# Patient Record
Sex: Female | Born: 1950 | Race: Black or African American | Hispanic: No | Marital: Married | Smoking: Never smoker
Health system: Southern US, Community
[De-identification: ages and names within clinical notes are randomized; demographics above are authoritative.]

## PROBLEM LIST (undated history)

## (undated) DIAGNOSIS — I10 Essential (primary) hypertension: Secondary | ICD-10-CM

## (undated) HISTORY — PX: NASAL SINUS SURGERY: SHX719

---

## 2011-03-23 ENCOUNTER — Emergency Department (HOSPITAL_COMMUNITY): Payer: No Typology Code available for payment source

## 2011-03-23 ENCOUNTER — Encounter (HOSPITAL_COMMUNITY): Payer: Self-pay | Admitting: Family Medicine

## 2011-03-23 ENCOUNTER — Emergency Department (HOSPITAL_COMMUNITY)
Admission: EM | Admit: 2011-03-23 | Discharge: 2011-03-23 | Disposition: A | Discharge status: Home / Self Care | Payer: No Typology Code available for payment source | Attending: Emergency Medicine | Admitting: Emergency Medicine

## 2011-03-23 DIAGNOSIS — S2232XA Fracture of one rib, left side, initial encounter for closed fracture: Secondary | ICD-10-CM

## 2011-03-23 DIAGNOSIS — Z79899 Other long term (current) drug therapy: Secondary | ICD-10-CM | POA: Insufficient documentation

## 2011-03-23 DIAGNOSIS — I10 Essential (primary) hypertension: Secondary | ICD-10-CM | POA: Insufficient documentation

## 2011-03-23 DIAGNOSIS — M25532 Pain in left wrist: Secondary | ICD-10-CM

## 2011-03-23 DIAGNOSIS — M25539 Pain in unspecified wrist: Secondary | ICD-10-CM | POA: Insufficient documentation

## 2011-03-23 DIAGNOSIS — R51 Headache: Secondary | ICD-10-CM | POA: Insufficient documentation

## 2011-03-23 DIAGNOSIS — S2239XA Fracture of one rib, unspecified side, initial encounter for closed fracture: Secondary | ICD-10-CM | POA: Insufficient documentation

## 2011-03-23 DIAGNOSIS — Y998 Other external cause status: Secondary | ICD-10-CM | POA: Insufficient documentation

## 2011-03-23 DIAGNOSIS — M549 Dorsalgia, unspecified: Secondary | ICD-10-CM | POA: Insufficient documentation

## 2011-03-23 DIAGNOSIS — Y92410 Unspecified street and highway as the place of occurrence of the external cause: Secondary | ICD-10-CM | POA: Insufficient documentation

## 2011-03-23 HISTORY — DX: Essential (primary) hypertension: I10

## 2011-03-23 LAB — BASIC METABOLIC PANEL
Calcium: 9.4 mg/dL (ref 8.4–10.5)
GFR calc Af Amer: >90 mL/min (ref >90)
GFR calc non Af Amer: >90 mL/min (ref >90)
Sodium: 138 mEq/L (ref 135–145)

## 2011-03-23 LAB — CBC
MCH: 27.1 pg (ref 26.0–34.0)
MCHC: 36.3 g/dL — ABNORMAL HIGH (ref 30.0–36.0)
Platelets: 280 10*3/uL (ref 150–400)
RBC: 4.62 MIL/uL (ref 3.87–5.11)

## 2011-03-23 MED ORDER — IOHEXOL 300 MG/ML  SOLN
100.0000 mL | Freq: Once | INTRAMUSCULAR | Status: AC | PRN
  Administered 2011-03-23: 100 mL via INTRAVENOUS

## 2011-03-23 MED ORDER — MORPHINE SULFATE 2 MG/ML IJ SOLN
INTRAMUSCULAR | Status: AC
  Filled 2011-03-23: qty 1

## 2011-03-23 MED ORDER — HYDROMORPHONE HCL PF 1 MG/ML IJ SOLN
0.5000 mg | Freq: Once | INTRAMUSCULAR | Status: AC
  Administered 2011-03-23: 0.5 mg via INTRAVENOUS
  Filled 2011-03-23 (×2): qty 1

## 2011-03-23 MED ORDER — MORPHINE SULFATE 4 MG/ML IJ SOLN
8.0000 mg | Freq: Once | INTRAMUSCULAR | Status: AC
  Administered 2011-03-23: 8 mg via INTRAVENOUS
  Filled 2011-03-23: qty 2

## 2011-03-23 MED ORDER — OXYCODONE-ACETAMINOPHEN 5-325 MG PO TABS: 1.0000 | ORAL_TABLET | ORAL | Status: AC | PRN

## 2011-03-23 MED ORDER — MORPHINE SULFATE 4 MG/ML IJ SOLN
INTRAMUSCULAR | Status: AC
  Filled 2011-03-23: qty 1

## 2011-03-23 MED ORDER — MORPHINE SULFATE 4 MG/ML IJ SOLN
8.0000 mg | Freq: Once | INTRAMUSCULAR | Status: AC
  Administered 2011-03-23: 8 mg via INTRAVENOUS
  Filled 2011-03-23: qty 1
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
## 2011-03-23 NOTE — ED Notes (Signed)
Pt return from. Son at bedside.
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
## 2011-03-23 NOTE — ED Notes (Signed)
Pt in radiology at this time. 
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
## 2011-03-23 NOTE — ED Notes (Signed)
Patient transported to X-ray 

## 2011-03-23 NOTE — ED Notes (Signed)
Family updated as to patient's status.

## 2013-09-14 ENCOUNTER — Emergency Department (HOSPITAL_COMMUNITY)
Admission: EM | Admit: 2013-09-14 | Discharge: 2013-09-14 | Disposition: A | Discharge status: Home / Self Care | Payer: BC Managed Care – PPO | Attending: Emergency Medicine | Admitting: Emergency Medicine

## 2013-09-14 ENCOUNTER — Encounter (HOSPITAL_COMMUNITY): Payer: Self-pay | Admitting: Emergency Medicine

## 2013-09-14 DIAGNOSIS — M545 Low back pain, unspecified: Secondary | ICD-10-CM | POA: Insufficient documentation

## 2013-09-14 DIAGNOSIS — Z79899 Other long term (current) drug therapy: Secondary | ICD-10-CM | POA: Insufficient documentation

## 2013-09-14 DIAGNOSIS — I10 Essential (primary) hypertension: Secondary | ICD-10-CM | POA: Insufficient documentation

## 2013-09-14 MED ORDER — DEXAMETHASONE SODIUM PHOSPHATE 4 MG/ML IJ SOLN
10.0000 mg | Freq: Once | INTRAMUSCULAR | Status: DC
  Filled 2013-09-14: qty 3

## 2013-09-14 MED ORDER — DEXAMETHASONE SODIUM PHOSPHATE 4 MG/ML IJ SOLN
10.0000 mg | Freq: Once | INTRAMUSCULAR | Status: AC
  Administered 2013-09-14: 10 mg via INTRAMUSCULAR

## 2013-09-14 MED ORDER — DEXAMETHASONE SODIUM PHOSPHATE 10 MG/ML IJ SOLN: 10.0000 mg | Freq: Once | INTRAMUSCULAR | Status: DC

## 2013-09-14 NOTE — Discharge Instructions (Signed)
Continue your medications given by your physician. Try heating pads. Add aleve or ibuprofen for additional symptom relief. Try stretches and exercises. Follow up with primary care doctor if not improving.    Back Pain, Adult Low back pain is very common. About 1 in 5 people have back pain.The cause of low back pain is rarely dangerous. The pain often gets better over time.About half of people with a sudden onset of back pain feel better in just 2 weeks. About 8 in 10 people feel better by 6 weeks.  CAUSES Some common causes of back pain include:  Strain of the muscles or ligaments supporting the spine.  Wear and tear (degeneration) of the spinal discs.  Arthritis.  Direct injury to the back. DIAGNOSIS Most of the time, the direct cause of low back pain is not known.However, back pain can be treated effectively even when the exact cause of the pain is unknown.Answering your caregiver's questions about your overall health and symptoms is one of the most accurate ways to make sure the cause of your pain is not dangerous. If your caregiver needs more information, he or she may order lab work or imaging tests (X-rays or MRIs).However, even if imaging tests show changes in your back, this usually does not require surgery. HOME CARE INSTRUCTIONS For many people, back pain returns.Since low back pain is rarely dangerous, it is often a condition that people can learn to [facility] Incmanageon their own.   Remain active. It is stressful on the back to sit or stand in one place. Do not sit, drive, or stand in one place for more than 30 minutes at a time. Take short walks on level surfaces as soon as pain allows.Try to increase the length of time you walk each day.  Do not stay in bed.Resting more than 1 or 2 days can delay your recovery.  Do not avoid exercise or work.Your body is made to move.It is not dangerous to be active, even though your back may hurt.Your back will likely heal faster if you return  to being active before your pain is gone.  Pay attention to your body when you bend and lift. Many people have less discomfortwhen lifting if they bend their knees, keep the load close to their bodies,and avoid twisting. Often, the most comfortable positions are those that put less stress on your recovering back.  Find a comfortable position to sleep. Use a firm mattress and lie on your side with your knees slightly bent. If you lie on your back, put a pillow under your knees.  Only take over-the-counter or prescription medicines as directed by your caregiver. Over-the-counter medicines to reduce pain and inflammation are often the most helpful.Your caregiver may prescribe muscle relaxant drugs.These medicines help dull your pain so you can more quickly return to your normal activities and healthy exercise.  Put ice on the injured area.  Put ice in a plastic bag.  Place a towel between your skin and the bag.  Leave the ice on for 15-20 minutes, 03-04 times a day for the first 2 to 3 days. After that, ice and heat may be alternated to reduce pain and spasms.  Ask your caregiver about trying back exercises and gentle massage. This may be of some benefit.  Avoid feeling anxious or stressed.Stress increases muscle tension and can worsen back pain.It is important to recognize when you are anxious or stressed and learn ways to manage it.Exercise is a great option. SEEK MEDICAL CARE IF:  You  have pain that is not relieved with rest or medicine.  You have pain that does not improve in 1 week.  You have new symptoms.  You are generally not feeling well. SEEK IMMEDIATE MEDICAL CARE IF:   You have pain that radiates from your back into your legs.  You develop new bowel or bladder control problems.  You have unusual weakness or numbness in your arms or legs.  You develop nausea or vomiting.  You develop abdominal pain.  You feel faint. Document Released: 02/11/2005 Document  Revised: 08/13/2011 Document Reviewed: 07/02/2010 [facility] Patient Information 2015 [place], [place]. This information is not intended to replace advice given to you by your health care provider. Make sure you discuss any questions you have with your health care provider.   Back Exercises Back exercises help treat and prevent back injuries. The goal of back exercises is to increase the strength of your abdominal and back muscles and the flexibility of your back. These exercises should be started when you no longer have back pain. Back exercises include:  Pelvic Tilt. Lie on your back with your knees bent. Tilt your pelvis until the lower part of your back is against the floor. Hold this position 5 to 10 sec and repeat 5 to 10 times.  Knee to Chest. Pull first 1 knee up against your chest and hold for 20 to 30 seconds, repeat this with the other knee, and then both knees. This may be done with the other leg straight or bent, whichever feels better.  Sit-Ups or Curl-Ups. Bend your knees 90 degrees. Start with tilting your pelvis, and do a partial, slow sit-up, lifting your trunk only 30 to 45 degrees off the floor. Take at least 2 to 3 seconds for each sit-up. Do not do sit-ups with your knees out straight. If partial sit-ups are difficult, simply do the above but with only tightening your abdominal muscles and holding it as directed.  Hip-Lift. Lie on your back with your knees flexed 90 degrees. Push down with your feet and shoulders as you raise your hips a couple inches off the floor; hold for 10 seconds, repeat 5 to 10 times.  Back arches. Lie on your stomach, propping yourself up on bent elbows. Slowly press on your hands, causing an arch in your low back. Repeat 3 to 5 times. Any initial stiffness and discomfort should lessen with repetition over time.  Shoulder-Lifts. Lie face down with arms beside your body. Keep hips and torso pressed to floor as you slowly lift your head and shoulders off the  floor. Do not overdo your exercises, especially in the beginning. Exercises may cause you some mild back discomfort which lasts for a few minutes; however, if the pain is more severe, or lasts for more than 15 minutes, do not continue exercises until you see your caregiver. Improvement with exercise therapy for back problems is slow.  See your caregivers for assistance with developing a proper back exercise program. Document Released: 03/21/2004 Document Revised: 05/06/2011 Document Reviewed: 12/13/2010 [facility] Patient Information 2015 [place], [place]. This information is not intended to replace advice given to you by your health care provider. Make sure you discuss any questions you have with your health care provider.

## 2013-09-14 NOTE — ED Provider Notes (Signed)
CSN: [id number]     Arrival date & time 09/14/13  1928 History  This chart was scribed for Jaynie Crumbleatyana Ainsley Sanguinetti, PA-C working with Richardean Canalavid H Yao, MD by Evon Slackerrance Branch, ED Scribe. This patient was seen in room TR07C/TR07C and the patient's care was started at 9:00 PM.     Chief Complaint  Patient presents with  . Back Pain   The history is provided by the patient. No language interpreter was used.   HPI Comments: Jacqueline Reed is a 63 y.o. female who presents to the Emergency Department complaining of lower back pain onset 3 days prior.  She states that there was no injury to her back. She states that the pain doesn't radiate. She states her pain worsens with twisting. She states she went to see her PCP who prescribed her Robaxin and Vicodin that she has been taking with no relief. She denies dysuria, fever, hematuria, or urinary frequency. No loss of bladder or bowel control. No abdominal pain  Past Medical History  Diagnosis Date  . Hypertension    Past Surgical History  Procedure Laterality Date  . Nasal sinus surgery     History reviewed. No pertinent family history. History  Substance Use Topics  . Smoking status: Never Smoker   . Smokeless tobacco: Not on file  . Alcohol Use: No   OB History   Grav Para Term Preterm Abortions TAB SAB Ect Mult Living                 Review of Systems  Constitutional: Negative for fever.  Genitourinary: Negative for dysuria, frequency and hematuria.  Musculoskeletal: Positive for back pain.    Allergies  Review of patient's allergies indicates no known allergies.  Home Medications   Prior to Admission medications   Medication Sig Start Date End Date Taking? Authorizing Provider  amLODipine-benazepril (LOTREL) 5-20 MG per capsule Take 1 capsule by mouth daily.    Historical Provider, MD  atorvastatin (LIPITOR) 20 MG tablet Take 20 mg by mouth daily.    Historical Provider, MD  Prenatal Vit-Fe Fumarate-FA (PRENATAL MULTIVITAMIN) TABS  Take 1 tablet by mouth daily.    Historical Provider, MD   Triage Vitals: BP 166/82  Pulse 83  Temp(Src) 97.9 F (36.6 C) (Oral)  Resp 16  Ht 5\' 10"  (1.778 m)  Wt 243 lb (110.224 kg)  BMI 34.87 kg/m2  SpO2 97%  Physical Exam  Nursing note and vitals reviewed. Constitutional: She is oriented to person, place, and time. She appears well-developed and well-nourished. No distress.  HENT:  Head: Normocephalic and atraumatic.  Eyes: Conjunctivae and EOM are normal.  Neck: Neck supple. No tracheal deviation present.  Cardiovascular: Normal rate.   Pulmonary/Chest: Effort normal. No respiratory distress.  Musculoskeletal: Normal range of motion.  Midline lumbar spine tenderness. No paravertebral tenderness bilaterally. No Pain with straight leg raise bilaterally.  Neurological: She is alert and oriented to person, place, and time.  5/5 and equal lower extremity strength. 2+ and equal patellar reflexes bilaterally. Pt able to dorsiflex bilateral toes and feet with good strength against resistance. Equal sensation bilaterally over thighs and lower legs.    Skin: Skin is warm and dry.  Psychiatric: She has a normal mood and affect. Her behavior is normal.    ED Course  Procedures (including critical care time) DIAGNOSTIC STUDIES: Oxygen Saturation is 97% on RA, normal by my interpretation.    COORDINATION OF CARE:    Labs Review Labs Reviewed - No data to  display  Imaging Review No results found.   EKG Interpretation None      MDM   Final diagnoses:  Midline low back pain without sciatica    Patient with lower back pain, with no radiation, no numbness, weakness, bowel or urine incontinence. Already has Norco and Flexeril at home for pain, she is here because she's concerned that her pain is not improving. Patient requesting a shot. We will do Decadron IM, and NSAIDs at home to her current medications. Follow with primary care Dr. No further merging imaging is  indicated  Filed Vitals:   09/14/13 1951  BP: 166/82  Pulse: 83  Temp: 97.9 F (36.6 C)  TempSrc: Oral  Resp: 16  Height: 5\' 10"  (1.778 m)  Weight: 243 lb (110.224 kg)  SpO2: 97%     I personally performed the services described in this documentation, which was scribed in my presence. The recorded information has been reviewed and is accurate.     Lottie Mussel, PA-C 09/15/13 0225

## 2013-09-14 NOTE — ED Notes (Signed)
Discharge instructions reviewed with pt. Pt verbalized understanding.   
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
## 2013-09-14 NOTE — ED Notes (Signed)
Pt reports pain to lower back that wraps around to the front. Pt reports going to pcp for pain, was given a muscle relaxant but pain has not gotten and better. Pt was taking aleve with no relief. 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 Reports pain 8-9/10.

## 2013-09-16 NOTE — ED Provider Notes (Signed)
Medical screening examination/treatment/procedure(s) were performed by non-physician practitioner and as supervising physician I was immediately available for consultation/collaboration.   EKG Interpretation None        Richardean Canalavid H Richardine Peppers, MD 09/16/13 1123

## 2016-10-17 ENCOUNTER — Encounter: Payer: Medicare Other | Admitting: Diagnostic Neuroimaging

## 2016-10-18 ENCOUNTER — Encounter: Payer: Self-pay | Admitting: Diagnostic Neuroimaging

## 2019-05-01 ENCOUNTER — Ambulatory Visit: Payer: Medicare PPO | Attending: Internal Medicine

## 2019-05-01 DIAGNOSIS — Z23 Encounter for immunization: Secondary | ICD-10-CM | POA: Insufficient documentation

## 2019-05-01 NOTE — Progress Notes (Signed)
   Covid-19 Vaccination Clinic  Name:  Jacqueline Reed    MRN: [id number] DOB: 1950-07-06  05/01/2019  [name] was observed post Covid-19 immunization for 15 minutes without incident. She was provided with Vaccine Information Sheet and instruction to access the V-Safe system.   [name] was instructed to call 911 with any severe reactions post vaccine: Marland Kitchen Difficulty breathing  . Swelling of face and throat  . A fast heartbeat  . A bad rash all over body  . Dizziness and weakness   Immunizations Administered    Name Date Dose VIS Date Route   Pfizer COVID-19 Vaccine 05/01/2019 10:10 AM 0.3 mL 02/05/2019 Intramuscular   Manufacturer: [employer], [employer]   Lot: [id number]   NDC: [address]-2

## 2019-06-01 ENCOUNTER — Ambulatory Visit: Payer: Medicare PPO | Attending: Internal Medicine

## 2019-06-01 DIAGNOSIS — Z23 Encounter for immunization: Secondary | ICD-10-CM

## 2019-06-01 NOTE — Progress Notes (Signed)
   Covid-19 Vaccination Clinic  Name:  Jacqueline Reed    MRN: [id number] DOB: 02-15-1951  06/01/2019  [name] was observed post Covid-19 immunization for 15 minutes without incident. She was provided with Vaccine Information Sheet and instruction to access the V-Safe system.   [name] was instructed to call 911 with any severe reactions post vaccine: Marland Kitchen Difficulty breathing  . Swelling of face and throat  . A fast heartbeat  . A bad rash all over body  . Dizziness and weakness   Immunizations Administered    Name Date Dose VIS Date Route   Pfizer COVID-19 Vaccine 06/01/2019  9:43 AM 0.3 mL 02/05/2019 Intramuscular   Manufacturer: [employer], [employer]   Lot: [id number]   NDC: [address]-9

## 2019-12-03 ENCOUNTER — Ambulatory Visit: Payer: Medicare PPO | Attending: Internal Medicine

## 2019-12-03 DIAGNOSIS — Z23 Encounter for immunization: Secondary | ICD-10-CM

## 2019-12-03 NOTE — Progress Notes (Signed)
   Covid-19 Vaccination Clinic  Name:  Jacqueline Reed    MRN: [id number] DOB: 1950/04/12  12/03/2019  [name] was observed post Covid-19 immunization for 15 minutes without incident. She was provided with Vaccine Information Sheet and instruction to access the V-Safe system.   [name] was instructed to call 911 with any severe reactions post vaccine: Marland Kitchen Difficulty breathing  . Swelling of face and throat  . A fast heartbeat  . A bad rash all over body  . Dizziness and weakness

## 2020-05-27 ENCOUNTER — Emergency Department
Admission: EM | Admit: 2020-05-27 | Discharge: 2020-05-27 | Disposition: A | Discharge status: Home / Self Care | Payer: Medicare PPO | Attending: Emergency Medicine | Admitting: Emergency Medicine

## 2020-05-27 ENCOUNTER — Encounter: Payer: Self-pay | Admitting: Intensive Care

## 2020-05-27 ENCOUNTER — Other Ambulatory Visit: Payer: Self-pay

## 2020-05-27 ENCOUNTER — Emergency Department: Payer: Medicare PPO

## 2020-05-27 DIAGNOSIS — S63501A Unspecified sprain of right wrist, initial encounter: Secondary | ICD-10-CM | POA: Diagnosis not present

## 2020-05-27 DIAGNOSIS — I10 Essential (primary) hypertension: Secondary | ICD-10-CM | POA: Diagnosis not present

## 2020-05-27 DIAGNOSIS — S199XXA Unspecified injury of neck, initial encounter: Secondary | ICD-10-CM | POA: Diagnosis present

## 2020-05-27 DIAGNOSIS — S161XXA Strain of muscle, fascia and tendon at neck level, initial encounter: Secondary | ICD-10-CM | POA: Insufficient documentation

## 2020-05-27 DIAGNOSIS — Z79899 Other long term (current) drug therapy: Secondary | ICD-10-CM | POA: Diagnosis not present

## 2020-05-27 DIAGNOSIS — J0100 Acute maxillary sinusitis, unspecified: Secondary | ICD-10-CM

## 2020-05-27 DIAGNOSIS — S39012A Strain of muscle, fascia and tendon of lower back, initial encounter: Secondary | ICD-10-CM | POA: Diagnosis not present

## 2020-05-27 DIAGNOSIS — S0990XA Unspecified injury of head, initial encounter: Secondary | ICD-10-CM | POA: Diagnosis not present

## 2020-05-27 DIAGNOSIS — Y92410 Unspecified street and highway as the place of occurrence of the external cause: Secondary | ICD-10-CM | POA: Insufficient documentation

## 2020-05-27 LAB — URINALYSIS, COMPLETE (UACMP) WITH MICROSCOPIC
Glucose, UA: NEGATIVE mg/dL
Hgb urine dipstick: NEGATIVE
Ketones, ur: 5 mg/dL — AB
Leukocytes,Ua: NEGATIVE
Nitrite: NEGATIVE
Protein, ur: 30 mg/dL — AB
Specific Gravity, Urine: 1.028 (ref 1.005–1.030)
pH: 5 (ref 5.0–8.0)

## 2020-05-27 MED ORDER — BACLOFEN 10 MG PO TABS: 10.0000 mg | ORAL_TABLET | Freq: Three times a day (TID) | ORAL | 0 refills | Status: AC

## 2020-05-27 MED ORDER — MELOXICAM 15 MG PO TABS: 15.0000 mg | ORAL_TABLET | Freq: Every day | ORAL | 2 refills | Status: AC

## 2020-05-27 MED ORDER — TRAMADOL HCL 50 MG PO TABS: 50.0000 mg | ORAL_TABLET | Freq: Four times a day (QID) | ORAL | 0 refills | Status: AC | PRN
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
## 2020-05-27 NOTE — Discharge Instructions (Addendum)
Follow-up with your regular doctor as needed Follow-up with [facility] for the problems with your sinuses noted on CT Follow-up with [facility] orthopedics if your neck and back pain or not improving in 5 to 7 days Take the meloxicam and baclofen as prescribed.  Tylenol as needed for pain.  Tramadol for pain not controlled by previous medications Return emergency department if you are worsening
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
## 2020-06-27 ENCOUNTER — Other Ambulatory Visit: Payer: Self-pay | Admitting: Unknown Physician Specialty

## 2020-06-27 DIAGNOSIS — J329 Chronic sinusitis, unspecified: Secondary | ICD-10-CM

## 2020-07-11 ENCOUNTER — Other Ambulatory Visit: Payer: Self-pay

## 2020-07-11 ENCOUNTER — Ambulatory Visit
Admission: RE | Admit: 2020-07-11 | Discharge: 2020-07-11 | Disposition: A | Discharge status: Home / Self Care | Payer: Medicare PPO | Source: Ambulatory Visit | Attending: Unknown Physician Specialty | Admitting: Unknown Physician Specialty

## 2020-07-11 DIAGNOSIS — J329 Chronic sinusitis, unspecified: Secondary | ICD-10-CM | POA: Diagnosis not present

## 2020-08-08 ENCOUNTER — Other Ambulatory Visit: Payer: Self-pay | Admitting: Unknown Physician Specialty

## 2020-08-08 DIAGNOSIS — J329 Chronic sinusitis, unspecified: Secondary | ICD-10-CM

## 2020-08-21 ENCOUNTER — Ambulatory Visit
Admission: RE | Admit: 2020-08-21 | Discharge: 2020-08-21 | Disposition: A | Discharge status: Home / Self Care | Payer: Medicare PPO | Source: Ambulatory Visit | Attending: Unknown Physician Specialty | Admitting: Unknown Physician Specialty

## 2020-08-21 ENCOUNTER — Other Ambulatory Visit: Payer: Self-pay

## 2020-08-21 DIAGNOSIS — J329 Chronic sinusitis, unspecified: Secondary | ICD-10-CM
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
## 2022-10-09 IMAGING — CT CT MAXILLOFACIAL W/O CM
3 of 5 series · 15 of 47 positions shown, 18 images · non-contrast
Comparison: 05/27/2020

CLINICAL DATA: Right sphenoid sinus disease on previous head CT,
preoperative evaluation

EXAM:
CT MAXILLOFACIAL WITHOUT CONTRAST
TECHNIQUE: Multidetector CT imaging of the maxillofacial structures was
performed. Multiplanar CT image reconstructions were also generated.

[Series 6: sinus 2.00 cor · coronal · 0.33mm/px · 3 of 68 slices shown]
[im 23/68  bone]
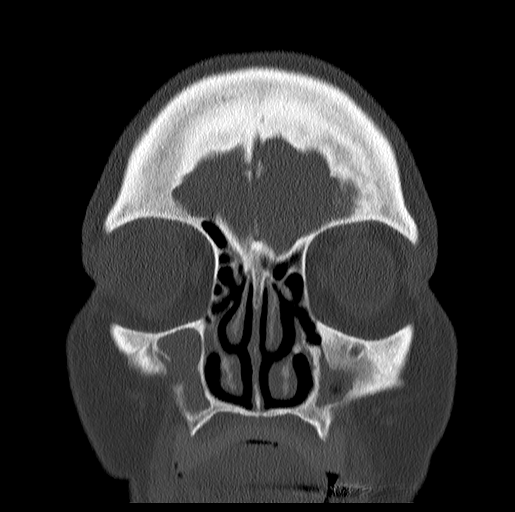
[im 30/68  bone]
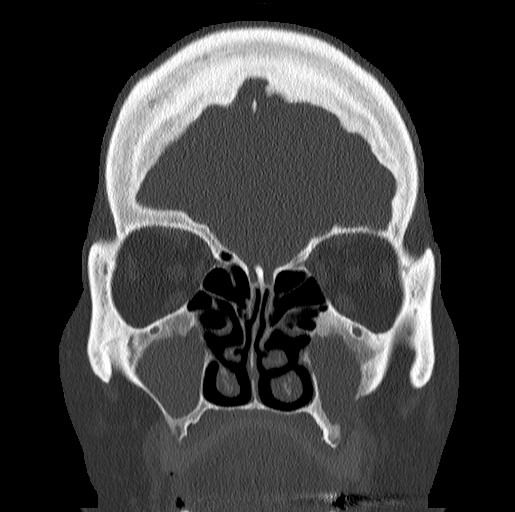
[im 38/68  bone]
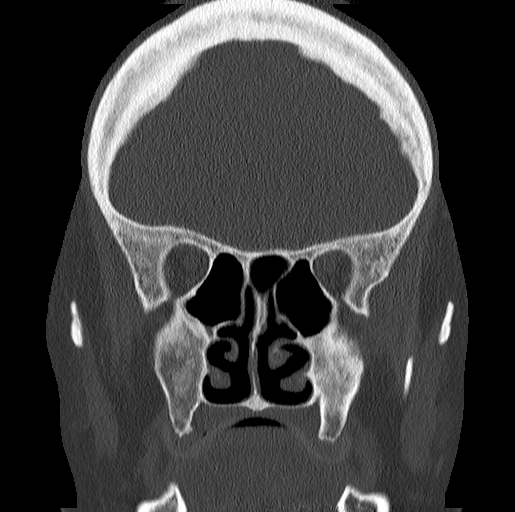

[Series 8: sinus 2.00 sag · sagittal · 0.27mm/px · 3 of 84 slices shown]
[im 28/84  bone]
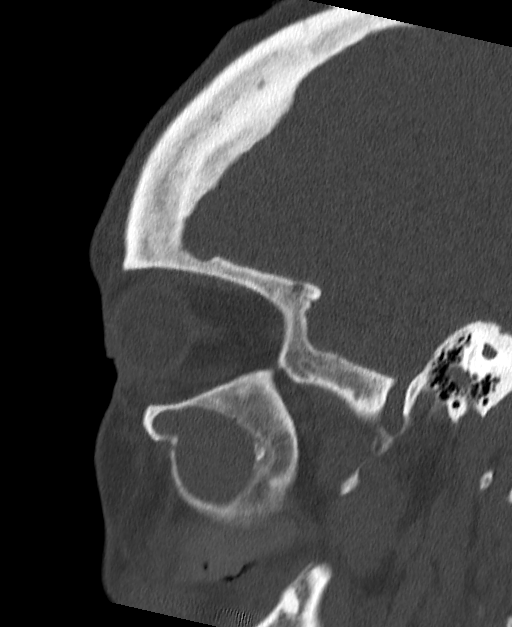
[im 42/84  bone]
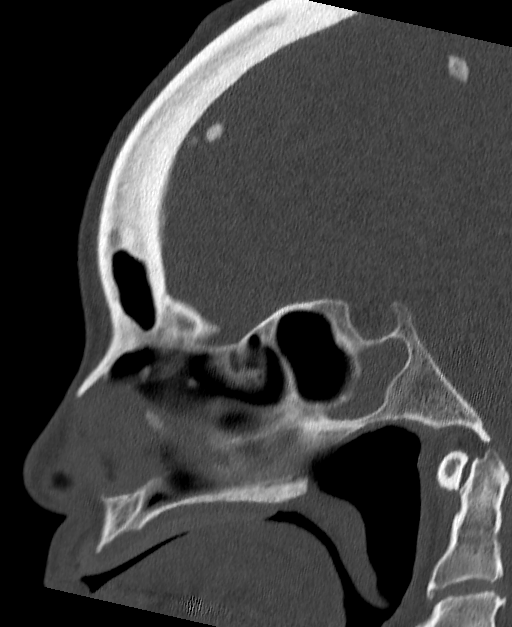
[im 56/84  bone]
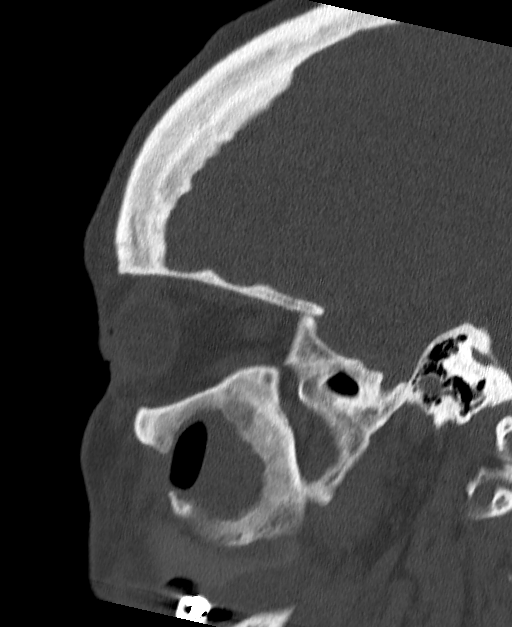

[Series 10: thins sinus 1.00 · axial · 0.34mm/px · z∈[-658,-515]mm · 9 of 167 slices shown, 12 images]
[im 12/167  brain]
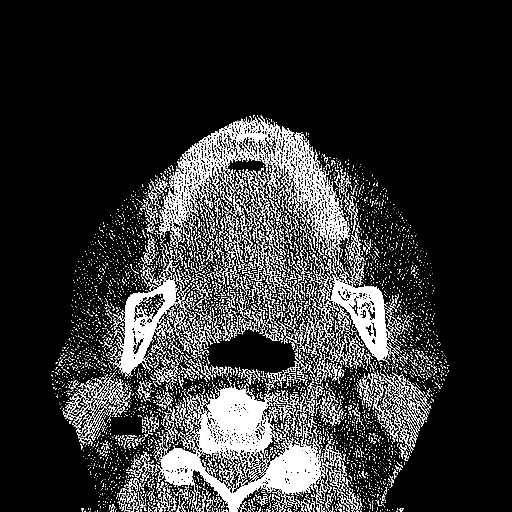
[im 12/167  bone]
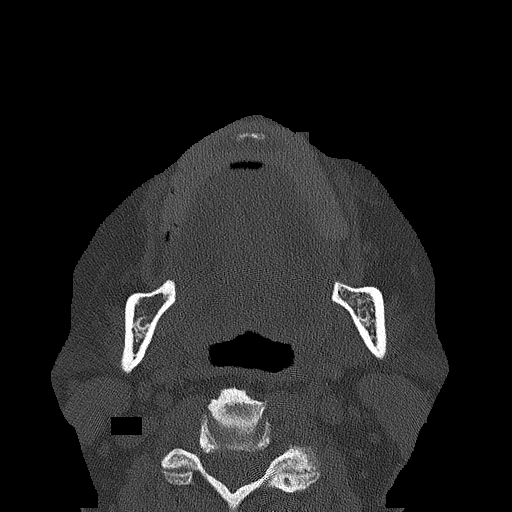
[im 36/167  bone]
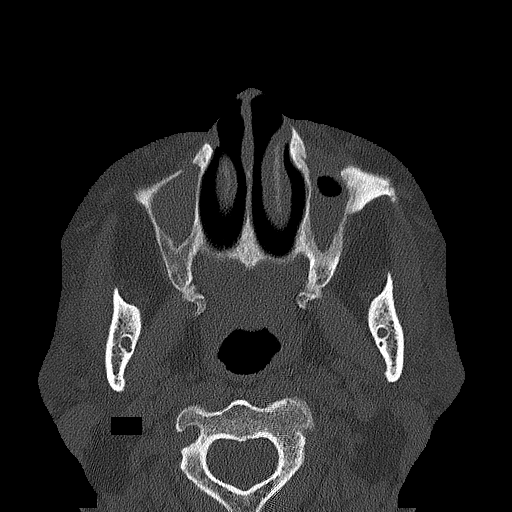
[im 48/167  bone]
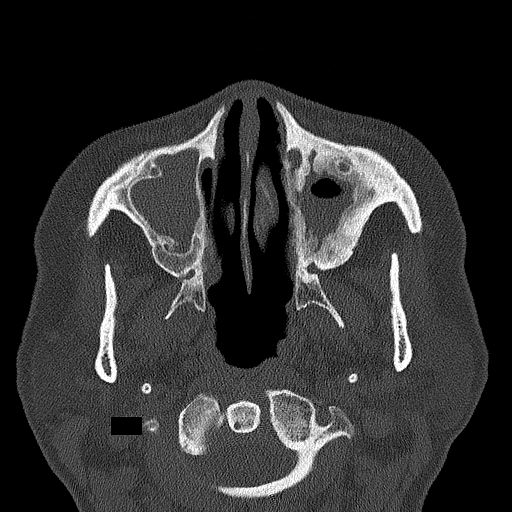
[im 72/167  bone]
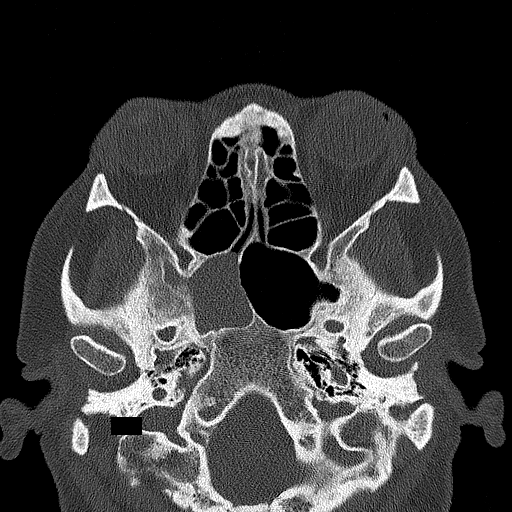
[im 84/167  brain]
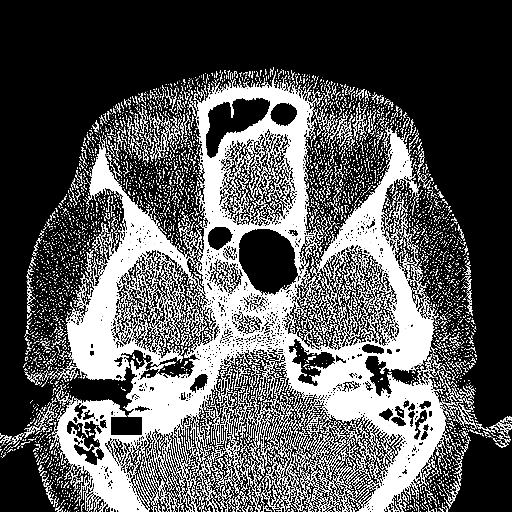
[im 84/167  bone]
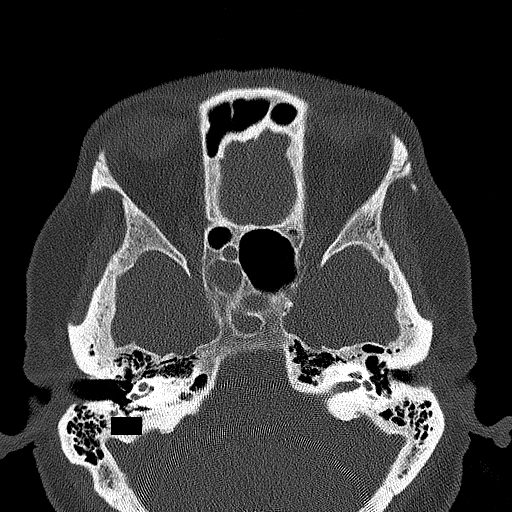
[im 95/167  bone]
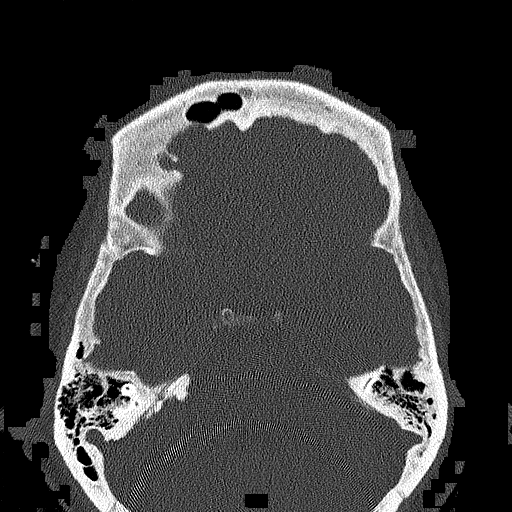
[im 119/167  bone]
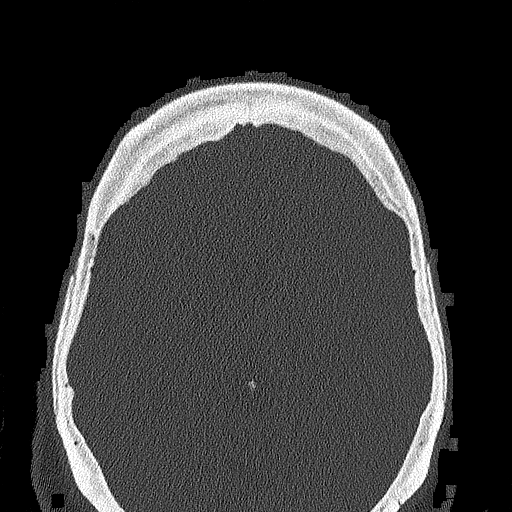
[im 131/167  bone]
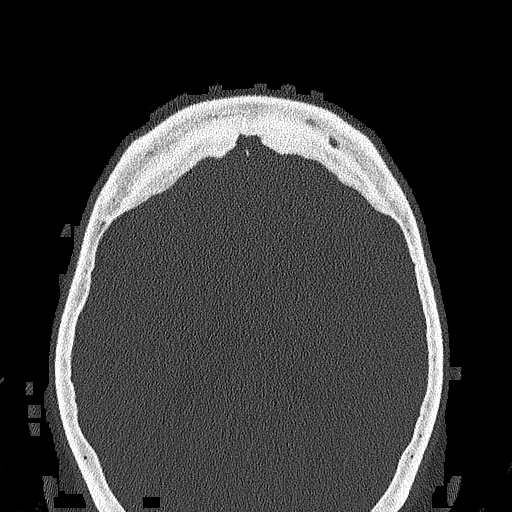
[im 155/167  brain]
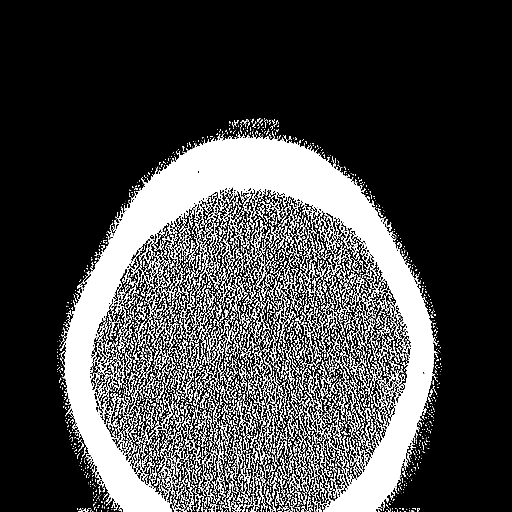
[im 155/167  bone]
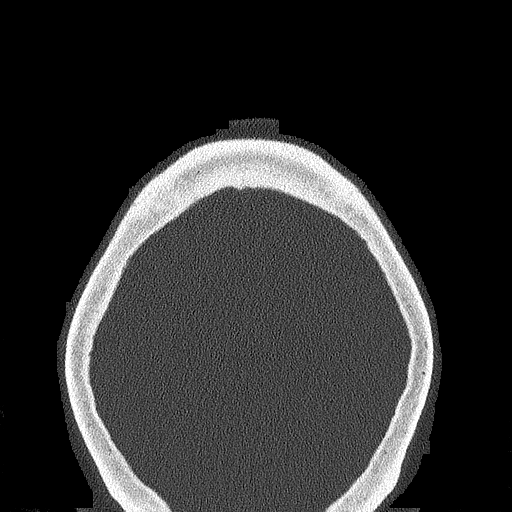

[15 of 47 positions shown; findings below may reference images not displayed]

FINDINGS: Osseous: There are no acute bony abnormalities.

Fatty striation is within the anterior walls of the bilateral
maxillary sinus and inferolateral wall the left maxillary sinus are
likely postsurgical. There is evidence of prior partial
ethmoidectomies.

Orbits: Negative. No traumatic or inflammatory finding.

Sinuses: There is opacification of the right sphenoid sinus, with
high attenuation again identified consistent with inspissated
secretions or superimposed infection.

Similarly, there is opacification of the right maxillary sinus also
with high attenuation. There is subtotal opacification of the left
maxillary sinus with a gas fluid level.

Soft tissues: Negative.

Limited intracranial: No significant or unexpected finding.
IMPRESSION: 1. Opacification with high attenuation secretions involving the
right maxillary and right sphenoid sinus as above. Findings are
consistent with inspissated secretions or superimposed infection. No
change since recent head CT.
2. Subtotal opacification of the left maxillary sinus with a gas
fluid level.
3. Postsurgical changes of the bilateral maxillary and ethmoid
sinuses consistent with prior surgical resection.

## 2022-11-19 IMAGING — CT CT MAXILLOFACIAL W/O CM
3 of 5 series · 14 of 47 positions shown, 16 images · non-contrast
Comparison: Maxillofacial CT 07/11/2020.

CLINICAL DATA: Chronic sinusitis, unspecified location. Additional
history provided: Patient reports feeling pressure and congestion
behind right eye. History of sinus surgery.

EXAM:
CT MAXILLOFACIAL WITHOUT CONTRAST
TECHNIQUE: Multidetector CT images of the paranasal sinuses were obtained using
the standard protocol without intravenous contrast.

[Series 6: sinus 2.00 cor · coronal · 0.34mm/px · 3 of 76 slices shown]
[im 26/76  bone]
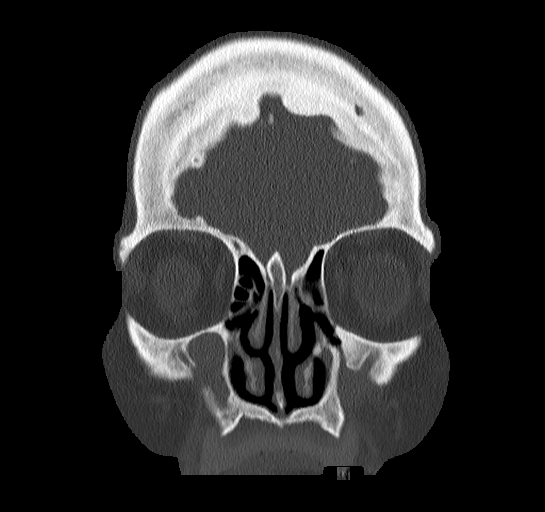
[im 34/76  bone]
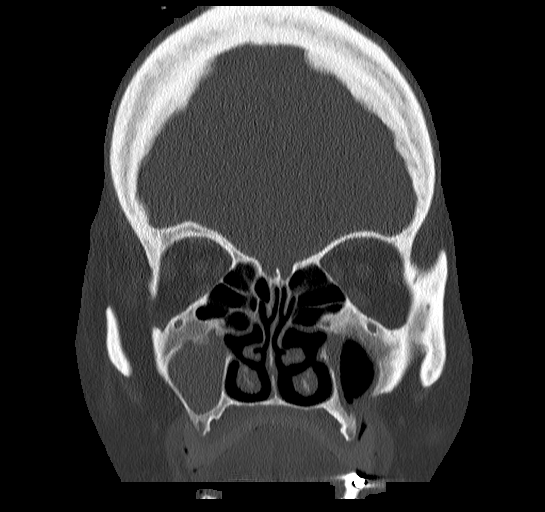
[im 42/76  bone]
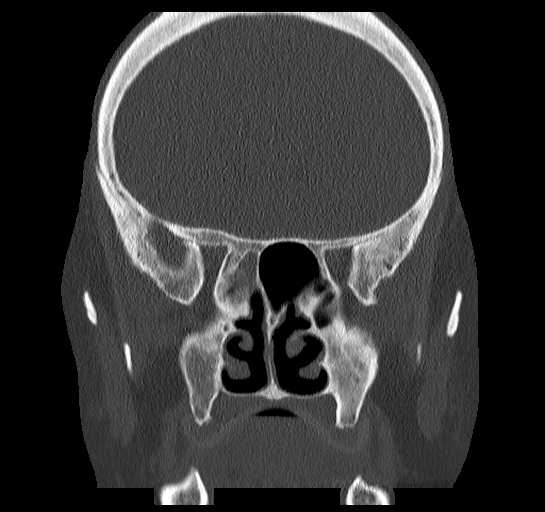

[Series 8: sinus 2.00 sag · sagittal · 0.30mm/px · 3 of 92 slices shown]
[im 31/92  bone]
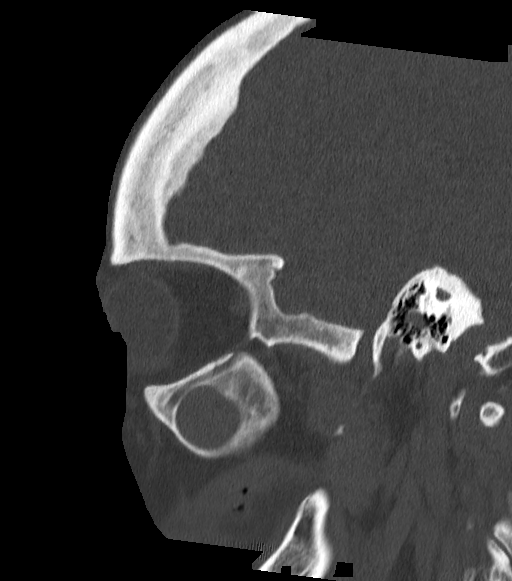
[im 46/92  bone]
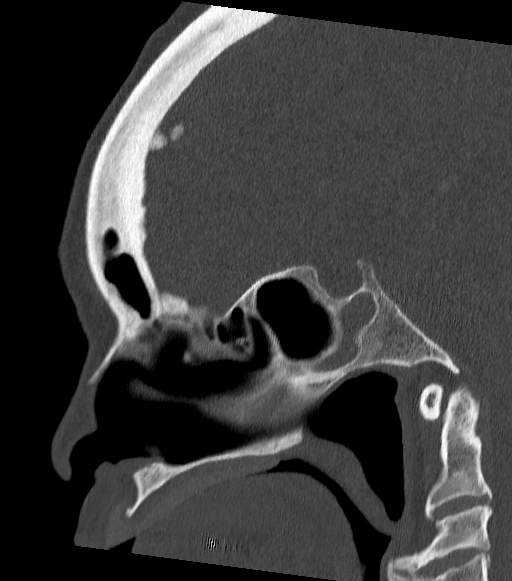
[im 61/92  bone]
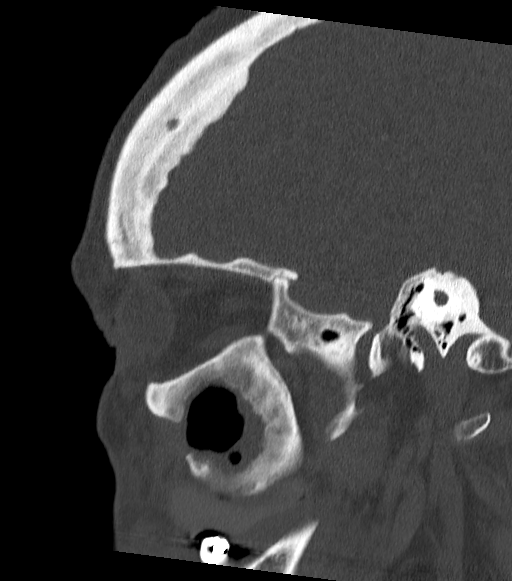

[Series 10: thins sinus 1.00 · axial · 0.38mm/px · z∈[-606,-466]mm · 8 of 164 slices shown, 10 images]
[im 12/164  brain]
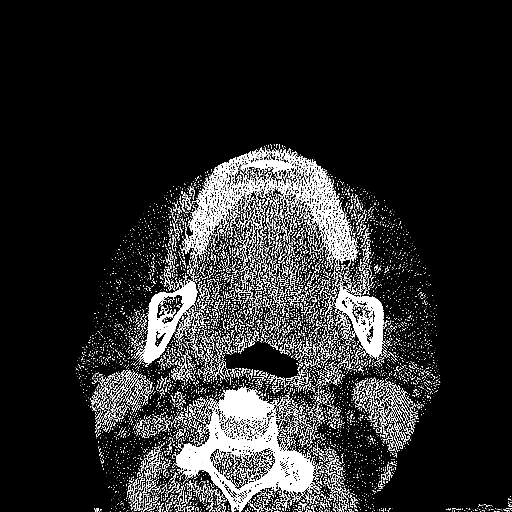
[im 12/164  bone]
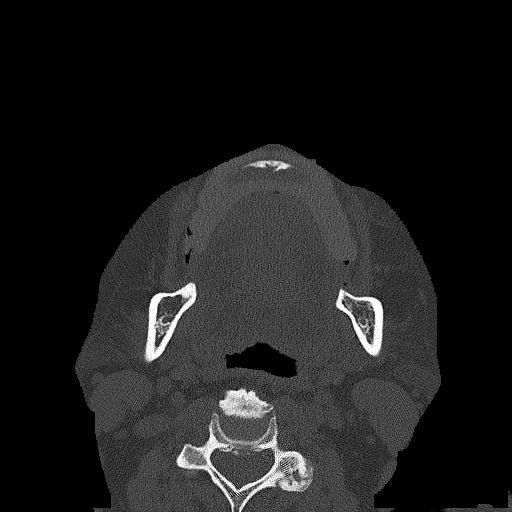
[im 35/164  bone]
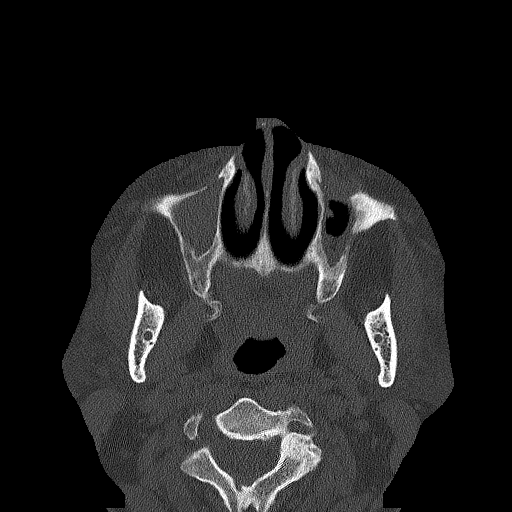
[im 59/164  bone]
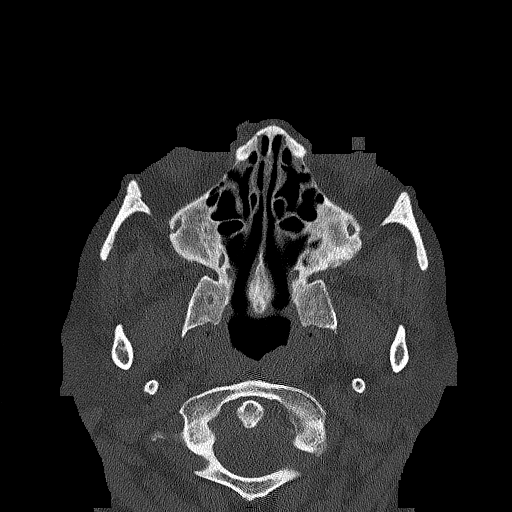
[im 70/164  bone]
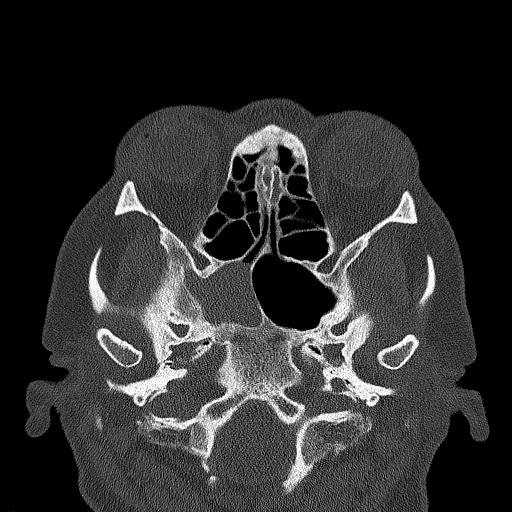
[im 94/164  brain]
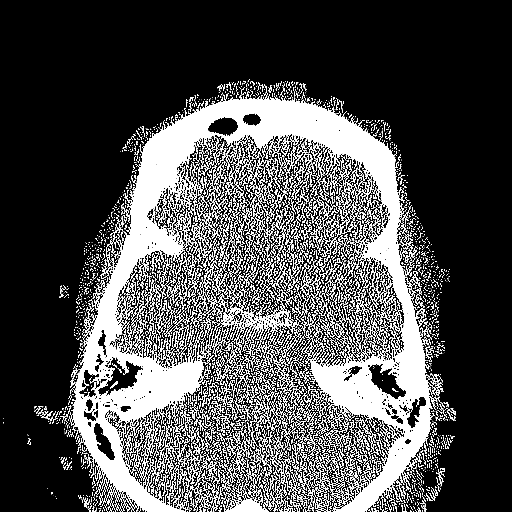
[im 94/164  bone]
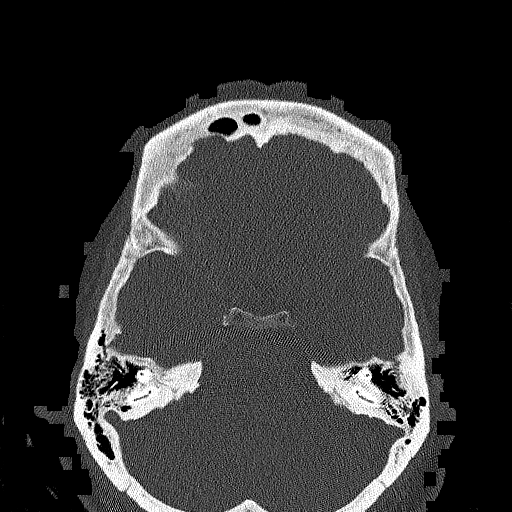
[im 105/164  bone]
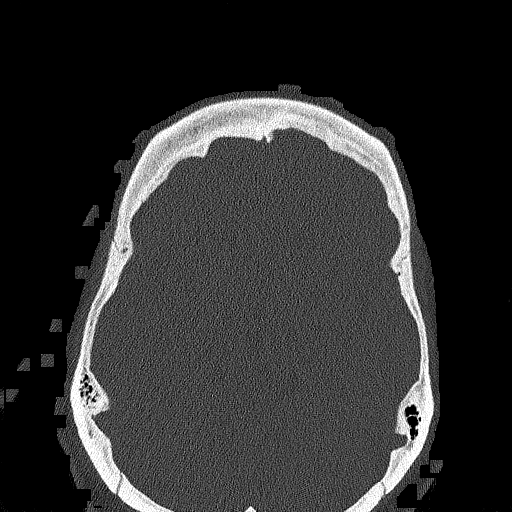
[im 129/164  bone]
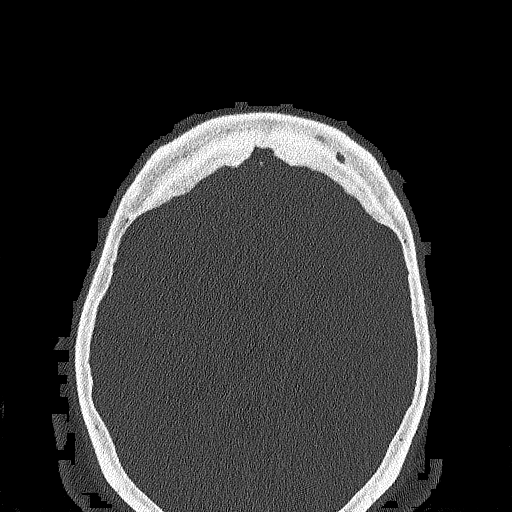
[im 152/164  bone]
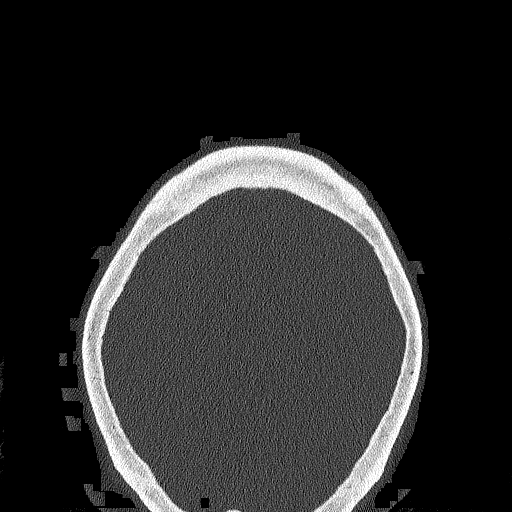

[14 of 47 positions shown; findings below may reference images not displayed]

FINDINGS: Paranasal sinuses:

Frontal: Normally aerated. Patent frontal sinus drainage pathways.

Ethmoid: Redemonstrated sequela of prior partial ethmoidectomy on
the left. Normally aerated.

Maxillary: Redemonstrated chronic bony defects within the anterior
walls of the maxillary sinuses bilaterally, and within the
inferolateral wall of the left maxillary sinus. As before, there is
essentially complete opacification of the right maxillary sinus with
associated chronic reactive osteitis. High attenuation within the
right maxillary sinus may reflect inspissated secretions or sequela
of chronic fungal sinusitis. Moderate/severe circumferential mucosal
thickening within the left maxillary sinus (slightly improved) with
redemonstrated chronic reactive osteitis.

Sphenoid: As before, there is complete opacification of the right
sphenoid sinus. High attenuation within the right sphenoid sinus may
reflect inspissated secretions or sequela of chronic fungal
sinusitis. The left sphenoid sinus is normally aerated. Patent
sphenoethmoidal recesses.

Right ostiomeatal unit: Patent.

Left ostiomeatal unit: The maxillary sinus ostium is opacified by
mucosal thickening and chronic reactive osteitis.

Nasal passages: Patent. Rightward deviation of the bony nasal
septum.

Anatomy: Pneumatization is present superior to the anterior ethmoid
notch on the right. Symmetric and intact olfactory grooves and fovea
ethmoidalis, Keros I (1-3mm). Presellar sphenoid pneumatization
pattern. No dehiscence of carotid or optic canals. No onodi cell.
IMPRESSION: Redemonstrated sequela of prior partial left ethmoidectomy.

Persistent complete opacification of the right sphenoid sinus with
associated chronic reactive osteitis.

As before, there is essentially complete opacification of the right
maxillary sinus. Persistent moderate/severe mucosal thickening
within the left maxillary sinus. Associated bilateral maxillary
sinus chronic reactive osteitis. The left maxillary sinus ostium is
opacified by mucosal thickening and chronic reactive osteitis.

High attenuation within the right sphenoid and right maxillary
sinuses may reflect inspissated secretions or sequela of chronic
fungal sinusitis.

Redemonstrated chronic bony defects within the anterior maxillary
sinus walls and inferolateral aspect of the left maxillary sinus.
